# Patient Record
Sex: Female | Born: 1955 | Race: White | Hispanic: No | Marital: Single | Smoking: Never smoker
Health system: Southern US, Community
[De-identification: ages and names within clinical notes are randomized; demographics above are authoritative.]

## PROBLEM LIST (undated history)

## (undated) DIAGNOSIS — E119 Type 2 diabetes mellitus without complications: Secondary | ICD-10-CM

---

## 2005-03-28 ENCOUNTER — Emergency Department (HOSPITAL_COMMUNITY): Admission: EM | Admit: 2005-03-28 | Discharge: 2005-03-28 | Payer: Self-pay | Admitting: Emergency Medicine

## 2007-02-07 IMAGING — CR DG FOOT COMPLETE 3+V*L*
4 series · 4 of 4 positions shown · non-contrast
Comparison: none

CLINICAL DATA: 49 year-old female twisted ankle.  Pain over lateral fourth and fifth metatarsal bones.
LEFT FOOT- 3 VIEW:

[t foot ap left]
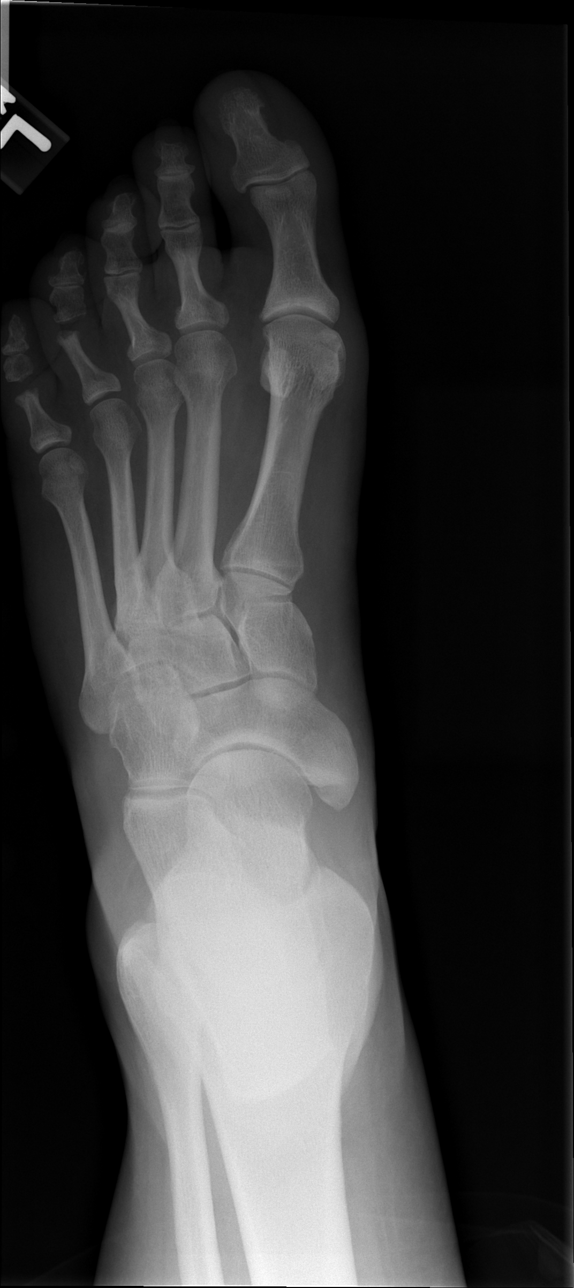

[t foot oblique left (1 of 2)]
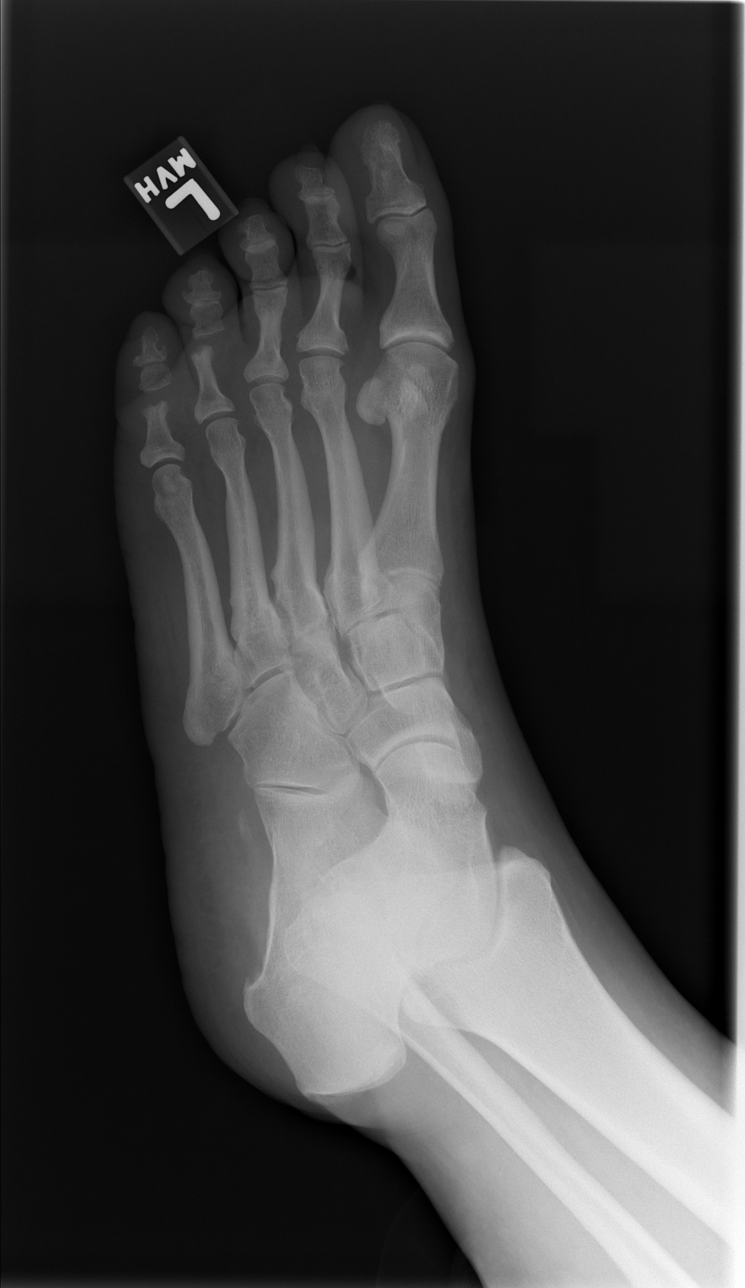

[t foot oblique left (2 of 2)]
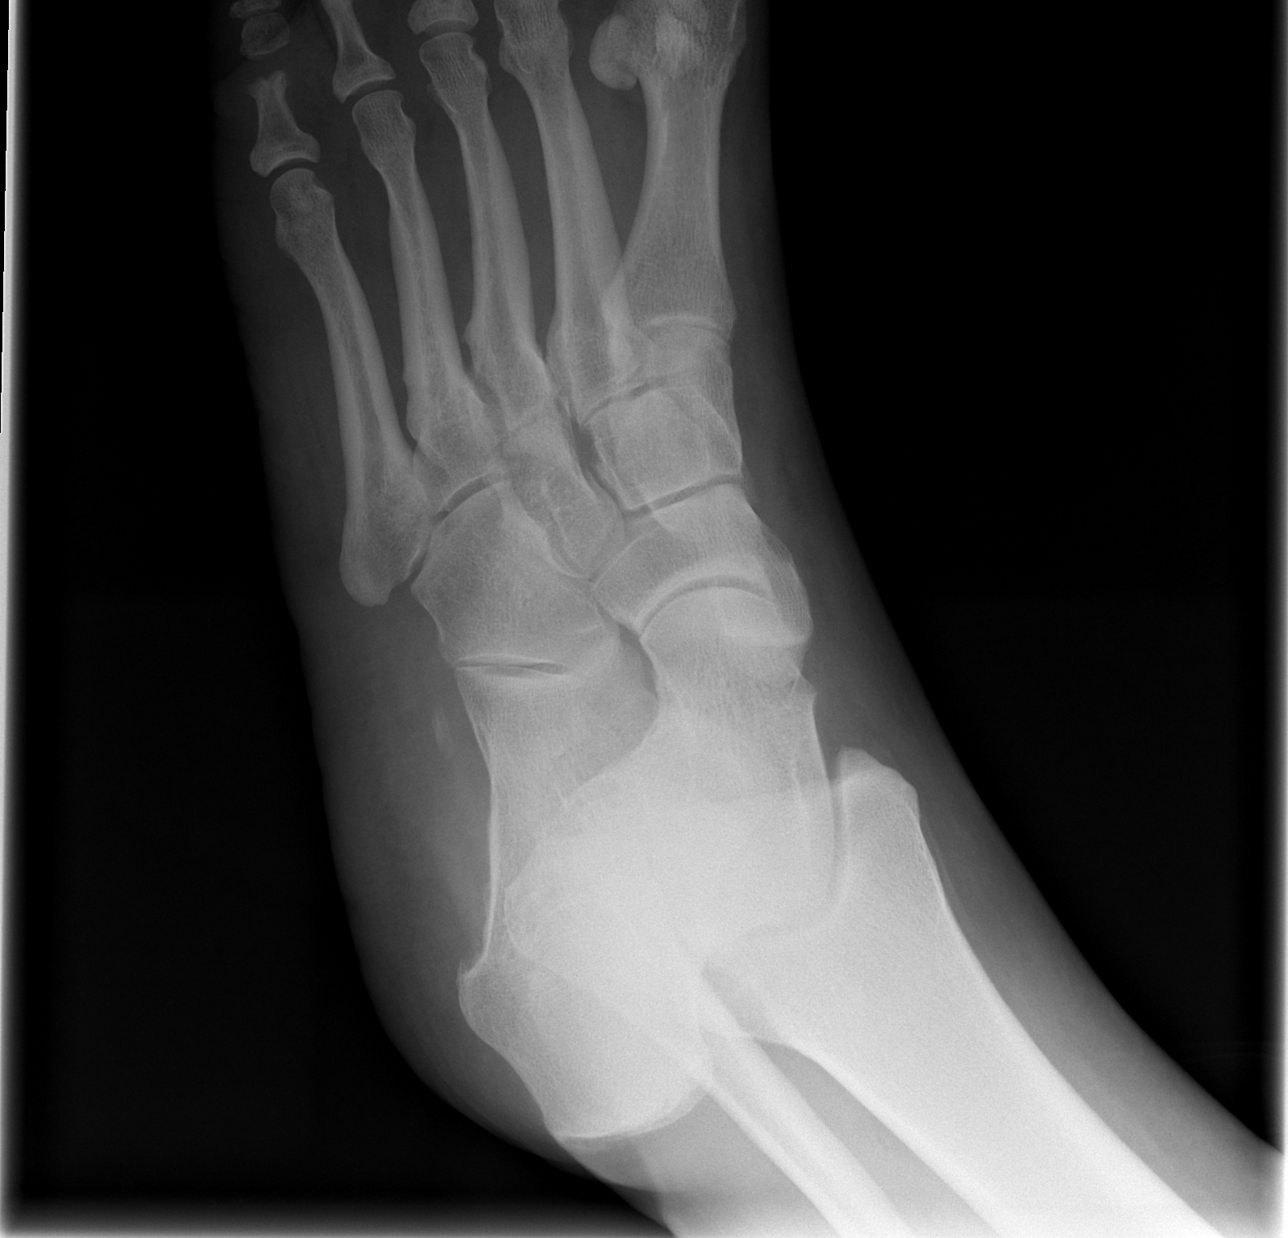

[t foot lat left]
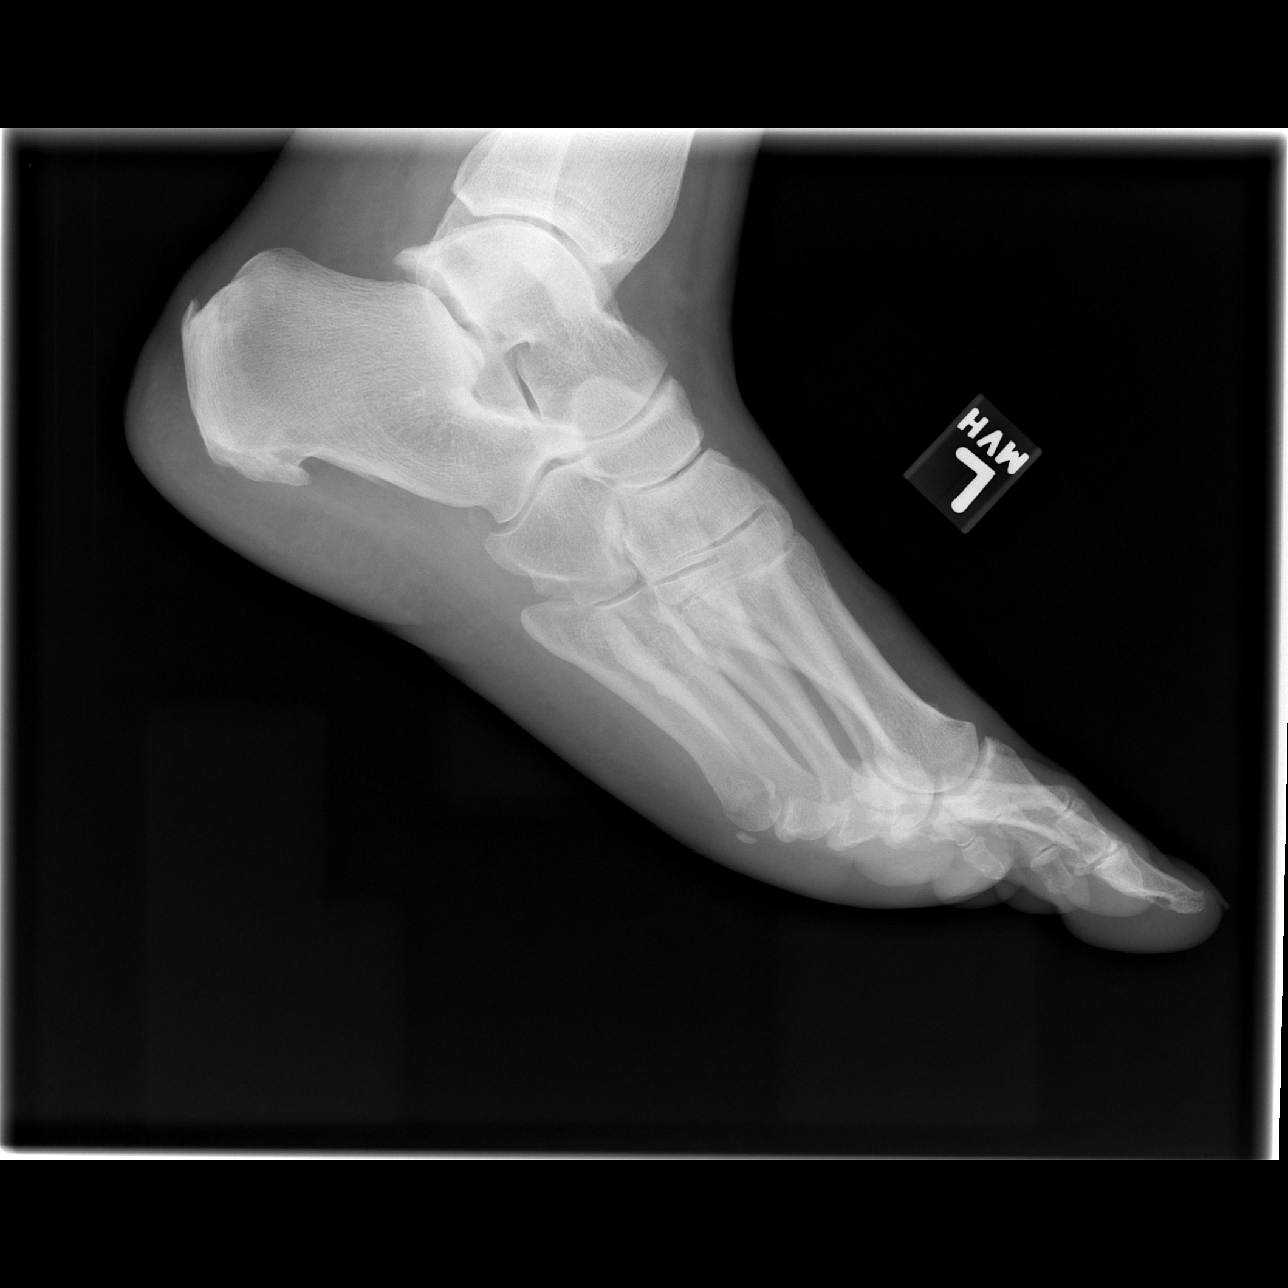

[4 of 4 positions shown; findings below may reference images not displayed]

FINDINGS: There mild soft tissue swelling lateral to the midfoot.  There is no underlying fracture.  There are post-operative changes of the heads of the proximal phalanx of the fourth and fifth digits.  There is calcification along lateral tendon adjacent to the calcaneus.  Plantar and Achilles tendon spurs are present at the calcaneusll.
IMPRESSION: 1.  Mild soft tissue swelling over the lateral aspect of the foot without underlying fracture.
2.  Post-operative changes 4th and 5th digits.

## 2019-03-04 ENCOUNTER — Emergency Department
Admission: EM | Admit: 2019-03-04 | Discharge: 2019-03-04 | Disposition: A | Discharge status: Home / Self Care | Payer: Self-pay | Source: Home / Self Care

## 2019-03-04 ENCOUNTER — Other Ambulatory Visit: Payer: Self-pay

## 2019-03-04 ENCOUNTER — Encounter: Payer: Self-pay | Admitting: Emergency Medicine

## 2019-03-04 DIAGNOSIS — R05 Cough: Secondary | ICD-10-CM

## 2019-03-04 DIAGNOSIS — R509 Fever, unspecified: Secondary | ICD-10-CM

## 2019-03-04 DIAGNOSIS — J069 Acute upper respiratory infection, unspecified: Secondary | ICD-10-CM

## 2019-03-04 DIAGNOSIS — G501 Atypical facial pain: Secondary | ICD-10-CM

## 2019-03-04 DIAGNOSIS — R448 Other symptoms and signs involving general sensations and perceptions: Secondary | ICD-10-CM

## 2019-03-04 LAB — POC SARS CORONAVIRUS 2 AG -  ED

## 2019-03-04 MED ORDER — PREDNISONE 20 MG PO TABS: 40.0000 mg | ORAL_TABLET | Freq: Every day | ORAL | 0 refills | Status: AC

## 2019-03-04 MED ORDER — IPRATROPIUM BROMIDE 0.03 % NA SOLN: 2.0000 | Freq: Two times a day (BID) | NASAL | 0 refills | Status: DC

## 2019-03-04 MED ORDER — BENZONATATE 100 MG PO CAPS: 100.0000 mg | ORAL_CAPSULE | Freq: Three times a day (TID) | ORAL | 0 refills | Status: DC | PRN

## 2019-03-04 MED ORDER — CEFDINIR 300 MG PO CAPS: 300.0000 mg | ORAL_CAPSULE | Freq: Two times a day (BID) | ORAL | 0 refills | Status: DC

## 2019-03-04 NOTE — ED Triage Notes (Signed)
Patient c/o sinus infection, pressure in face 2-3 days, dry cough, occasional runny nose.

## 2019-03-04 NOTE — Discharge Instructions (Addendum)
Covid test was invalid today, you declined a send out covid test. Flu test was negative. Continue to monitor your temperature at home and if symptoms worsen or if fever presents, return for another COVID test  I am treating you for a viral Upper Respiratory illness:  Start the following: Prednisone 40 mg x 5 days with breakfast for facial pressure and shortness of breath. Atrovent nasal spray, 2 sprays per nares TID PRN for nasal congestion  Benzonatate for cough 100-200 mg up to 3 times daily as needed for cough.  I have also sent a prescription for Omnicef 300 mg twice daily to pick-up 03/09/19 if symptoms have not resolved with prescribed therapy.  If symptoms have not improved within 5 days, please follow-up with PCP.

## 2019-03-04 NOTE — ED Provider Notes (Signed)
[facility]    CSN: [id number] Arrival date & time: 03/04/19  1410      History   Chief Complaint Chief Complaint  Patient presents with  . Facial Pain    HPI Anna Osborne is a 63 y.o. female.   HPI Anna Osborne presents today with a complaint of nasal congestion, increased facial pressure, shortness of breath, dry non productive cough x 2 days. Pt denied fever, febrile on arrival today. Denies known COVID-19 exposure. Reports a history of frequent sinus infection and thinks fever has been present. Reports mucus has been mixed with blood. Initially refused both COVID-19 and flu testing. Discussed importance of ruling out COVID-19 and Influenza as patient is high risk for complication if either viral illness are present. Pt reluctantly agreed to both test. Denies sore throat or GI symptoms.  History reviewed. No pertinent past medical history.  There are no problems to display for this patient.   History reviewed. No pertinent surgical history.  OB History   No obstetric history on file.      Home Medications    Prior to Admission medications   Not on File    Family History No family history on file.  Social History Social History   Tobacco Use  . Smoking status: Never Smoker  . Smokeless tobacco: Never Used  Substance Use Topics  . Alcohol use: Not on file  . Drug use: Not on file     Allergies   Patient has no known allergies.   Review of Systems Review of Systems Pertinent negatives listed in HPI  Physical Exam Triage Vital Signs ED Triage Vitals  Enc Vitals Group     BP 03/04/19 1444 131/73     Pulse Rate 03/04/19 1444 97     Resp --      Temp 03/04/19 1444 100.3 F (37.9 C)     Temp Source 03/04/19 1444 Oral     SpO2 03/04/19 1444 95 %     Weight 03/04/19 1446 220 lb (99.8 kg)     Height 03/04/19 1446 5\' 7"  (1.702 m)     Head Circumference --      Peak Flow --      Pain Score 03/04/19 1445 0     Pain Loc --      Pain Edu?  --      Excl. in [place]? --    No data found.  Updated Vital Signs BP 131/73 (BP Location: Right Arm)   Pulse 97   Temp 100.3 F (37.9 C) (Oral)   Ht 5\' 7"  (1.702 m)   Wt 220 lb (99.8 kg)   SpO2 95%   BMI 34.46 kg/m   Visual Acuity Right Eye Distance:   Left Eye Distance:   Bilateral Distance:    Right Eye Near:   Left Eye Near:    Bilateral Near:     Physical Exam Constitutional:      General: She is not in acute distress.    Appearance: Normal appearance. She is ill-appearing.  HENT:     Head: Normocephalic.     Right Ear: Tympanic membrane normal.     Left Ear: Tympanic membrane normal.     Nose: Mucosal edema, congestion and rhinorrhea present.     Right Turbinates: Not swollen.     Left Turbinates: Enlarged.     Mouth/Throat:     Mouth: Mucous membranes are dry.     Pharynx: No posterior oropharyngeal erythema.  Eyes:  Extraocular Movements: Extraocular movements intact.     Conjunctiva/sclera: Conjunctivae normal.  Cardiovascular:     Rate and Rhythm: Normal rate.     Heart sounds: Normal heart sounds.  Pulmonary:     Effort: Pulmonary effort is normal.     Comments: Diminished bilateral lung sounds. Negative wheezing, rhonchi, or crackles Skin:    General: Skin is warm.  Neurological:     General: No focal deficit present.     Mental Status: She is alert.  Psychiatric:        Mood and Affect: Mood normal.      UC Treatments / Results  Labs (all labs ordered are listed, but only abnormal results are displayed) Labs Reviewed - No data to display  EKG   Radiology No results found.  Procedures Procedures (including critical care time)  Medications Ordered in UC Medications - No data to display  Initial Impression / Assessment and Plan / UC Course  I have reviewed the triage vital signs and the nursing notes.  Pertinent labs & imaging results that were available during my care of the patient were reviewed by me and considered in my  medical decision making (see chart for details).   POC COVID testing performed, invalid result. Patient declined send out PCR testing to rule out covid. Rapid influenza performed-negative. Treated for viral upper respiratory illness, given acute onset of illness and symptom present for only 48 hours, will initiate treatment for symptoms only.   Prednisone 40 mg x 5 days with breakfast for facial pressure and shortness of breath. Atrovent nasal spray, 2 sprays per nares TID PRN for nasal congestion Benzonatate for cough 100-200 mg up to 3 times daily as needed for cough. Antibiotic is on-file at pharmacy for patient to pick-up in 5 days if symptom have not improved with initial treatment. Advised pharmacy will not fill antibiotic prior to 03/09/19. Pt verbalized understanding and agreement with plan.  Final Clinical Impressions(s) / UC Diagnoses   Final diagnoses:  Upper respiratory infection, acute  Facial pressure  Fever, unspecified     Discharge Instructions     Covid test was invalid today, you declined a send out covid test. Flu test was negative. Continue to monitor your temperature at home and if symptoms worsen or if fever presents, return for another COVID test  I am treating you for a viral Upper Respiratory illness:  Start the following: Prednisone 40 mg x 5 days with breakfast for facial pressure and shortness of breath. Atrovent nasal spray, 2 sprays per nares TID PRN for nasal congestion  Benzonatate for cough 100-200 mg up to 3 times daily as needed for cough.  I have also sent a prescription for Omnicef 300 mg twice daily to pick-up 03/09/19 if symptoms have not resolved with prescribed therapy.  If symptoms have not improved within 5 days, please follow-up with PCP.    ED Prescriptions    Medication Sig Dispense Auth. Provider   predniSONE (DELTASONE) 20 MG tablet Take 2 tablets (40 mg total) by mouth daily with breakfast for 5 days. 10 tablet Bing Neighbors, FNP   benzonatate (TESSALON) 100 MG capsule Take 1-2 capsules (100-200 mg total) by mouth 3 (three) times daily as needed for cough. 40 capsule Bing Neighbors, FNP   ipratropium (ATROVENT) 0.03 % nasal spray Place 2 sprays into both nostrils 2 (two) times daily. 30 mL Bing Neighbors, FNP   cefdinir (OMNICEF) 300 MG capsule Take 1 capsule (300 mg total)  by mouth 2 (two) times daily. 20 capsule Bing NeighborsHarris, Semaja Lymon S, FNP     PDMP not reviewed this encounter.   Bing NeighborsHarris, Jobina Maita S, FNP 03/05/19 1205

## 2019-03-06 LAB — POC INFLUENZA A AND B ANTIGEN (URGENT CARE ONLY)
Influenza A Ag: NEGATIVE
Influenza B Ag: NEGATIVE

## 2019-12-18 ENCOUNTER — Emergency Department
Admission: EM | Admit: 2019-12-18 | Discharge: 2019-12-18 | Disposition: A | Discharge status: Home / Self Care | Payer: Self-pay | Source: Home / Self Care

## 2019-12-18 ENCOUNTER — Encounter: Payer: Self-pay | Admitting: Emergency Medicine

## 2019-12-18 ENCOUNTER — Other Ambulatory Visit: Payer: Self-pay

## 2019-12-18 DIAGNOSIS — J0111 Acute recurrent frontal sinusitis: Secondary | ICD-10-CM

## 2019-12-18 MED ORDER — AMOXICILLIN-POT CLAVULANATE 875-125 MG PO TABS: 1.0000 | ORAL_TABLET | Freq: Two times a day (BID) | ORAL | 0 refills | Status: DC

## 2019-12-18 NOTE — ED Provider Notes (Signed)
[facility]    CSN: [id number] Arrival date & time: 12/18/19  1112      History   Chief Complaint Chief Complaint  Patient presents with  . Nasal Congestion    HPI Anna Osborne is a 64 y.o. female.   HPI  Patient presents with acute nasal congestion, facial pain and pressure. No cough.  No known exposure to COVID. Patient is unvaccinated however, declines COVID-19 testing. Afebrile. Reports mild cough. Endorses sinus infection at least once or twice per year. Denies SOB, wheezing, or chest pain.  History reviewed. No pertinent past medical history.  There are no problems to display for this patient.   History reviewed. No pertinent surgical history.  OB History   No obstetric history on file.      Home Medications    Prior to Admission medications   Not on File    Family History Family History  Problem Relation Age of Onset  . Cancer Mother   . ALS Father     Social History Social History   Tobacco Use  . Smoking status: Never Smoker  . Smokeless tobacco: Never Used  Vaping Use  . Vaping Use: Never used  Substance Use Topics  . Alcohol use: Not Currently  . Drug use: Not Currently     Allergies   Patient has no known allergies. Review of Systems Review of Systems Pertinent negatives listed in HPI   Physical Exam Triage Vital Signs ED Triage Vitals  Enc Vitals Group     BP 12/18/19 1148 140/77     Pulse Rate 12/18/19 1148 97     Resp --      Temp 12/18/19 1148 99.3 F (37.4 C)     Temp Source 12/18/19 1148 Oral     SpO2 12/18/19 1148 98 %     Weight 12/18/19 1149 220 lb (99.8 kg)     Height 12/18/19 1149 5\' 7"  (1.702 m)     Head Circumference --      Peak Flow --      Pain Score 12/18/19 1149 0     Pain Loc --      Pain Edu? --      Excl. in [place]? --    No data found.  Updated Vital Signs BP 140/77 (BP Location: Right Arm)   Pulse 97   Temp 99.3 F (37.4 C) (Oral)   Ht 5\' 7"  (1.702 m)   Wt 220 lb (99.8 kg)    SpO2 98%   BMI 34.46 kg/m   Visual Acuity Right Eye Distance:   Left Eye Distance:   Bilateral Distance:    Right Eye Near:   Left Eye Near:    Bilateral Near:     Physical Exam General Appearance:    Alert, cooperative, no distress  HENT:  Normocephalic, external ears normal, bilateral nares mucosal edema and purulent discharge noted, maxillary and frontal sinus tenderness  Eyes:    PERRL, conjunctiva/corneas clear, EOM's intact       Lungs:     Clear to auscultation bilaterally, respirations unlabored  Heart:    Regular rate and rhythm  Neurologic:   Awake, alert, oriented x 3. No apparent focal neurological           defect.         UC Treatments / Results  Labs (all labs ordered are listed, but only abnormal results are displayed) Labs Reviewed - No data to display  EKG   Radiology No results  found.  Procedures Procedures (including critical care time)  Medications Ordered in UC Medications - No data to display  Initial Impression / Assessment and Plan / UC Course  I have reviewed the triage vital signs and the nursing notes.  Pertinent labs & imaging results that were available during my care of the patient were reviewed by me and considered in my medical decision making (see chart for details).   Acute sinusitis uncomplicated covering empirically with Augmentin. Patient declined COVID-19 testing and is at high risk given unvaccinated status. Red flags discussed patient verbalized understanding and agreement with plan provided information to get established with primary care.  Final Clinical Impressions(s) / UC Diagnoses   Final diagnoses:  Acute recurrent frontal sinusitis   Discharge Instructions   None    ED Prescriptions    Medication Sig Dispense Auth. Provider   amoxicillin-clavulanate (AUGMENTIN) 875-125 MG tablet Take 1 tablet by mouth 2 (two) times daily. 20 tablet Bing Neighbors, FNP     PDMP not reviewed this encounter.   Bing Neighbors, FNP 12/20/19 1347

## 2019-12-18 NOTE — ED Triage Notes (Signed)
Congestion, Left ear pressure x 3 days Unvaccinated

## 2020-09-16 ENCOUNTER — Other Ambulatory Visit: Payer: Self-pay

## 2020-09-16 ENCOUNTER — Emergency Department
Admission: EM | Admit: 2020-09-16 | Discharge: 2020-09-16 | Disposition: A | Discharge status: Home / Self Care | Payer: Self-pay | Source: Home / Self Care

## 2020-09-16 ENCOUNTER — Encounter: Payer: Self-pay | Admitting: Emergency Medicine

## 2020-09-16 DIAGNOSIS — L03315 Cellulitis of perineum: Secondary | ICD-10-CM

## 2020-09-16 DIAGNOSIS — R000 Tachycardia, unspecified: Secondary | ICD-10-CM

## 2020-09-16 DIAGNOSIS — K604 Rectal fistula: Secondary | ICD-10-CM

## 2020-09-16 NOTE — ED Triage Notes (Signed)
Patient states that she a "lump" in her rectal area x 3-4 days.  Patient is having lower abdominal pain as well.  Patient has not had a BM x 3 days.  Patient is taken Advil for the discomfort and hydrocortisone cream.  No history of hemorrhoids.

## 2020-09-16 NOTE — ED Provider Notes (Addendum)
[facility]    CSN: [id number] Arrival date & time: 09/16/20  1010      History   Chief Complaint Chief Complaint  Patient presents with   Abscess    HPI Anna Osborne is a 65 y.o. female.   Patient presents today with a 3 to 4-day history of painful lump in her rectal region.  She reports pain is rated 10 on a 0-10 pain scale, localized to rectum with radiation throughout perineum, described as aching, no aggravating or alleviating factors identified.  She does report associated low-grade fever, abdominal pain, foul-smelling discharge.  She denies any history of Crohn's disease or ulcerative colitis.  Denies history of anorectal fistula.  She has tried over-the-counter medications including Advil and hydrocortisone cream without improvement of symptoms.  She denies any recent antibiotic use.  She was recently diagnosed with diabetes but denies history of immunosuppression.   History reviewed. No pertinent past medical history.  There are no problems to display for this patient.   History reviewed. No pertinent surgical history.  OB History   No obstetric history on file.      Home Medications    Prior to Admission medications   Medication Sig Start Date End Date Taking? Authorizing Provider  losartan (COZAAR) 50 MG tablet Take 50 mg by mouth daily. 08/06/20  Yes [provider]  Vitamin D, Ergocalciferol, (DRISDOL) 1.25 MG (50000 UNIT) CAPS capsule Take 50,000 Units by mouth once a week. 08/01/20  Yes [provider]    Family History Family History  Problem Relation Age of Onset   Cancer Mother    ALS Father     Social History Social History   Tobacco Use   Smoking status: Never   Smokeless tobacco: Never  Vaping Use   Vaping Use: Never used  Substance Use Topics   Alcohol use: Not Currently   Drug use: Not Currently     Allergies   Other   Review of Systems Review of Systems  Constitutional:  Positive for activity  change. Negative for appetite change, fatigue and fever.  Respiratory:  Negative for cough and shortness of breath.   Cardiovascular:  Negative for chest pain.  Gastrointestinal:  Negative for abdominal pain, diarrhea, nausea and vomiting.  Musculoskeletal:  Negative for arthralgias and myalgias.  Skin:  Positive for color change and wound.  Neurological:  Negative for dizziness, light-headedness and headaches.    Physical Exam Triage Vital Signs ED Triage Vitals  Enc Vitals Group     BP 09/16/20 1039 (!) 155/84     Pulse Rate 09/16/20 1039 (!) 125     Resp --      Temp 09/16/20 1039 98.3 F (36.8 C)     Temp Source 09/16/20 1039 Oral     SpO2 09/16/20 1039 97 %     Weight 09/16/20 1040 210 lb (95.3 kg)     Height 09/16/20 1040 5\' 7"  (1.702 m)     Head Circumference --      Peak Flow --      Pain Score 09/16/20 1040 10     Pain Loc --      Pain Edu? --      Excl. in [place]? --    No data found.  Updated Vital Signs BP (!) 155/84 (BP Location: Right Arm)   Pulse (!) 125   Temp 98.3 F (36.8 C) (Oral)   Ht 5\' 7"  (1.702 m)   Wt 210 lb (95.3 kg)  SpO2 97%   BMI 32.89 kg/m   Visual Acuity Right Eye Distance:   Left Eye Distance:   Bilateral Distance:    Right Eye Near:   Left Eye Near:    Bilateral Near:     Physical Exam Vitals reviewed.  Constitutional:      General: She is awake. She is not in acute distress.    Appearance: Normal appearance. She is not ill-appearing.     Comments: Pleasant female obviously uncomfortable in no acute distress sitting on exam room table  HENT:     Head: Normocephalic and atraumatic.  Cardiovascular:     Rate and Rhythm: Regular rhythm. Tachycardia present.     Heart sounds: Normal heart sounds, S1 normal and S2 normal. No murmur heard. Pulmonary:     Effort: Pulmonary effort is normal.     Breath sounds: Normal breath sounds. No wheezing, rhonchi or rales.     Comments: Clear auscultation bilaterally Abdominal:     General:  Bowel sounds are normal.     Palpations: Abdomen is soft.     Tenderness: There is no abdominal tenderness. There is no right CVA tenderness, left CVA tenderness, guarding or rebound.  Genitourinary:    Labia:        Right: Rash and tenderness present.        Left: Rash and tenderness present.      Rectum: Tenderness present.     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 Psychiatric:        Behavior: Behavior is cooperative.     UC Treatments / Results  Labs (all labs ordered are listed, but only abnormal results are displayed) Labs Reviewed - No data to display  EKG   Radiology No results found.  Procedures Procedures (including critical care time)  Medications Ordered in UC Medications - No data to display  Initial Impression / Assessment and Plan / UC Course  I have reviewed the triage vital signs and the nursing notes.  Pertinent labs & imaging results that were available during my care of the patient were reviewed by me and considered in my medical decision making (see chart for details).      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  Discussed with patient that I am concerned for SIRS given clinical presentation and that we need stat labs as well as imaging which cannot be obtained in urgent care setting.  Recommended patient go directly to the emergency room for further evaluation and to rule out life-threatening infection.  Patient was agreeable to this and will go directly to [facility] for further evaluation and management.  Vital signs are stable at the time of discharge patient is safe for private transport and will have her brother drive her directly to ER following visit.  Final Clinical Impressions(s) / UC Diagnoses   Final diagnoses:  None      Discharge Instructions      I am concerned that you have a very significant infection of your perineum.  It is important that we get imaging and stat labs to determine if we need to start IV antibiotics or do any surgical procedures.  Please go directly to the emergency room as we discussed.     ED Prescriptions   None    PDMP not reviewed this encounter.   Anna Osborne, Noberto Retort,  PA-C 09/16/20 1106    Anna Osborne, Noberto Retort, PA-C 09/16/20 1110

## 2020-09-16 NOTE — Discharge Instructions (Addendum)
I am concerned that you have a very significant infection of your perineum.  It is important that we get imaging and stat labs to determine if we need to start IV antibiotics or do any surgical procedures.  Please go directly to the emergency room as we discussed.

## 2021-12-06 ENCOUNTER — Ambulatory Visit
Admission: EM | Admit: 2021-12-06 | Discharge: 2021-12-06 | Disposition: A | Discharge status: Home / Self Care | Payer: Medicare HMO | Attending: Family Medicine | Admitting: Family Medicine

## 2021-12-06 ENCOUNTER — Encounter: Payer: Self-pay | Admitting: Emergency Medicine

## 2021-12-06 DIAGNOSIS — J069 Acute upper respiratory infection, unspecified: Secondary | ICD-10-CM

## 2021-12-06 HISTORY — DX: Type 2 diabetes mellitus without complications: E11.9

## 2021-12-06 MED ORDER — FLUTICASONE PROPIONATE 50 MCG/ACT NA SUSP: 2.0000 | Freq: Every day | NASAL | 0 refills | Status: AC

## 2021-12-06 MED ORDER — AMOXICILLIN 875 MG PO TABS: 875.0000 mg | ORAL_TABLET | Freq: Two times a day (BID) | ORAL | 0 refills | Status: AC

## 2021-12-06 NOTE — Discharge Instructions (Signed)
Take Mucinex DM for the cough and congestion Use Flonase to help take down the nasal drainage Drink lots of water If you fail to improve over the next few days, if you start feeling worse then fill the prescription for amoxicillin

## 2021-12-06 NOTE — ED Triage Notes (Signed)
Patient c/o scratchy throat from drainage, itchy ears, sinus pressure x 1 day.  Patient has taken Nyquil.

## 2021-12-06 NOTE — ED Provider Notes (Signed)
[facility]    CSN: [id number] Arrival date & time: 12/06/21  1505      History   Chief Complaint Chief Complaint  Patient presents with   Sore Throat    HPI Anna Osborne is a 66 y.o. female.   HPI Patient states that she has runny stuffy nose, postnasal drip, sinus pressure and pain, scratchy throat and some fatigue since yesterday.  No sweats or chills.  No body ache.  Does not desire COVID testing.  She has had COVID vaccinations.  She feels like this is a sinus infection.  She feels like she is prone to these.  She has not taken any over-the-counter medicines.  She has diabetes and hypertension, wonders what she can safely take. Past Medical History:  Diagnosis Date   Diabetes mellitus without complication (HCC)     There are no problems to display for this patient.   History reviewed. No pertinent surgical history.  OB History   No obstetric history on file.      Home Medications    Prior to Admission medications   Medication Sig Start Date End Date Taking? Authorizing Provider  amLODipine (NORVASC) 5 MG tablet Take 5 mg by mouth daily. 10/01/21  Yes [provider]  amoxicillin (AMOXIL) 875 MG tablet Take 1 tablet (875 mg total) by mouth 2 (two) times daily. 12/06/21  Yes Eustace Moore, MD  fluticasone [facility]) 50 MCG/ACT nasal spray Place 2 sprays into both nostrils daily. 12/06/21  Yes Eustace Moore, MD  losartan (COZAAR) 50 MG tablet Take 50 mg by mouth daily. 08/06/20  Yes [provider]  metFORMIN (GLUCOPHAGE) 1000 MG tablet Take by mouth. 10/23/20  Yes [provider]  NOVOLIN N RELION 100 UNIT/ML injection SMARTSIG:12 Unit(s) SUB-Q 3 Times Daily 10/01/21  Yes [provider]  nystatin (MYCOSTATIN/NYSTOP) powder Apply topically. 03/11/21  Yes [provider]  Vitamin D, Ergocalciferol, (DRISDOL) 1.25 MG (50000 UNIT) CAPS capsule Take 50,000 Units by mouth once a week. 08/01/20  Yes [provider]    Family History Family History  Problem Relation Age of Onset   Cancer Mother    ALS Father     Social History Social History   Tobacco Use   Smoking status: Never   Smokeless tobacco: Never  Vaping Use   Vaping Use: Never used  Substance Use Topics   Alcohol use: Not Currently   Drug use: Not Currently     Allergies   Other   Review of Systems Review of Systems  See HPI Physical Exam Triage Vital Signs ED Triage Vitals  Enc Vitals Group     BP 12/06/21 1533 133/80     Pulse Rate 12/06/21 1533 81     Resp 12/06/21 1533 18     Temp 12/06/21 1533 98.7 F (37.1 C)     Temp Source 12/06/21 1533 Oral     SpO2 12/06/21 1533 98 %     Weight 12/06/21 1535 202 lb (91.6 kg)     Height 12/06/21 1535 5\' 7"  (1.702 m)     Head Circumference --      Peak Flow --      Pain Score 12/06/21 1534 0     Pain Loc --      Pain Edu? --      Excl. in GC? --    No data found.  Updated Vital Signs BP 133/80 (BP Location: Right Arm)   Pulse 81   Temp  98.7 F (37.1 C) (Oral)   Resp 18   Ht 5\' 7"  (1.702 m)   Wt 91.6 kg   SpO2 98%   BMI 31.64 kg/m       Physical Exam Vitals reviewed.  Constitutional:      General: She is not in acute distress.    Appearance: She is well-developed. She is ill-appearing.     Comments: Overweight.  HENT:     Head: Normocephalic and atraumatic.     Right Ear: Tympanic membrane and ear canal normal.     Left Ear: Tympanic membrane and ear canal normal.     Nose: Congestion and rhinorrhea present.     Mouth/Throat:     Pharynx: No posterior oropharyngeal erythema.  Eyes:     Conjunctiva/sclera: Conjunctivae normal.     Pupils: Pupils are equal, round, and reactive to light.  Cardiovascular:     Rate and Rhythm: Normal rate and regular rhythm.     Heart sounds: Normal heart sounds.  Pulmonary:     Effort: Pulmonary effort is normal. No respiratory distress.     Breath sounds: Normal breath sounds.  Abdominal:      General: There is no distension.     Palpations: Abdomen is soft.  Musculoskeletal:        General: Normal range of motion.     Cervical back: Normal range of motion.  Lymphadenopathy:     Cervical: No cervical adenopathy.  Skin:    General: Skin is warm and dry.  Neurological:     Mental Status: She is alert.     Gait: Gait abnormal.     Comments: Antalgic gait due to " bad knees"      UC Treatments / Results  Labs (all labs ordered are listed, but only abnormal results are displayed) Labs Reviewed - No data to display  EKG   Radiology No results found.  Procedures Procedures (including critical care time)  Medications Ordered in UC Medications - No data to display  Initial Impression / Assessment and Plan / UC Course  I have reviewed the triage vital signs and the nursing notes.  Pertinent labs & imaging results that were available during my care of the patient were reviewed by me and considered in my medical decision making (see chart for details).     Explained likely viral illness.  Patient feels she needs antibiotics.  Is given a written prescription to use if she fails to improve in 7 days , instructions given Final Clinical Impressions(s) / UC Diagnoses   Final diagnoses:  Viral upper respiratory tract infection     Discharge Instructions      Take Mucinex DM for the cough and congestion Use Flonase to help take down the nasal drainage Drink lots of water If you fail to improve over the next few days, if you start feeling worse then fill the prescription for amoxicillin   ED Prescriptions     Medication Sig Dispense Auth. Provider   fluticasone (FLONASE) 50 MCG/ACT nasal spray Place 2 sprays into both nostrils daily. 16 g Raylene Everts, MD   amoxicillin (AMOXIL) 875 MG tablet Take 1 tablet (875 mg total) by mouth 2 (two) times daily. 14 tablet Raylene Everts, MD      PDMP not reviewed this encounter.   Raylene Everts,  MD 12/07/21 [phone]
